# Patient Record
Sex: Male | Born: 1985 | Race: White | Hispanic: No | Marital: Single | Smoking: Never smoker
Health system: Southern US, Community
[De-identification: ages and names within clinical notes are randomized; demographics above are authoritative.]

---

## 2006-04-27 ENCOUNTER — Inpatient Hospital Stay (HOSPITAL_COMMUNITY): Admission: AC | Admit: 2006-04-27 | Discharge: 2006-04-28 | Payer: Self-pay

## 2015-11-15 ENCOUNTER — Encounter (HOSPITAL_COMMUNITY): Payer: Self-pay | Admitting: *Deleted

## 2015-11-15 ENCOUNTER — Emergency Department (HOSPITAL_COMMUNITY)
Admission: EM | Admit: 2015-11-15 | Discharge: 2015-11-16 | Disposition: A | Discharge status: Home / Self Care | Payer: No Typology Code available for payment source | Attending: Emergency Medicine | Admitting: Emergency Medicine

## 2015-11-15 ENCOUNTER — Emergency Department (HOSPITAL_COMMUNITY): Payer: No Typology Code available for payment source

## 2015-11-15 DIAGNOSIS — S60511A Abrasion of right hand, initial encounter: Secondary | ICD-10-CM | POA: Diagnosis not present

## 2015-11-15 DIAGNOSIS — Z23 Encounter for immunization: Secondary | ICD-10-CM | POA: Diagnosis not present

## 2015-11-15 DIAGNOSIS — S5002XA Contusion of left elbow, initial encounter: Secondary | ICD-10-CM | POA: Diagnosis not present

## 2015-11-15 DIAGNOSIS — Y92410 Unspecified street and highway as the place of occurrence of the external cause: Secondary | ICD-10-CM | POA: Diagnosis not present

## 2015-11-15 DIAGNOSIS — Y998 Other external cause status: Secondary | ICD-10-CM | POA: Insufficient documentation

## 2015-11-15 DIAGNOSIS — S51032A Puncture wound without foreign body of left elbow, initial encounter: Secondary | ICD-10-CM | POA: Diagnosis not present

## 2015-11-15 DIAGNOSIS — S59901A Unspecified injury of right elbow, initial encounter: Secondary | ICD-10-CM | POA: Diagnosis present

## 2015-11-15 DIAGNOSIS — Y9389 Activity, other specified: Secondary | ICD-10-CM | POA: Diagnosis not present

## 2015-11-15 DIAGNOSIS — S50811A Abrasion of right forearm, initial encounter: Secondary | ICD-10-CM | POA: Diagnosis not present

## 2015-11-15 DIAGNOSIS — S50312A Abrasion of left elbow, initial encounter: Secondary | ICD-10-CM | POA: Diagnosis not present
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
## 2015-11-16 MED ORDER — LIDOCAINE-EPINEPHRINE 2 %-1:100000 IJ SOLN
INTRAMUSCULAR | Status: AC
Start: 2015-11-16 — End: 2015-11-16
  Administered 2015-11-16: 02:00:00
  Filled 2015-11-16: qty 1

## 2015-11-16 MED ORDER — NAPROXEN 500 MG PO TABS: ORAL_TABLET | ORAL | Status: AC

## 2015-11-16 MED ORDER — TETANUS-DIPHTH-ACELL PERTUSSIS 5-2.5-18.5 LF-MCG/0.5 IM SUSP
0.5000 mL | Freq: Once | INTRAMUSCULAR | Status: AC
  Administered 2015-11-16: 0.5 mL via INTRAMUSCULAR
  Filled 2015-11-16: qty 0.5

## 2015-11-16 MED ORDER — NAPROXEN 500 MG PO TABS: 500.0000 mg | ORAL_TABLET | Freq: Once | ORAL | Status: DC

## 2015-11-16 MED ORDER — METHOCARBAMOL 500 MG PO TABS: ORAL_TABLET | ORAL | Status: AC

## 2015-11-16 MED ORDER — LIDOCAINE-EPINEPHRINE (PF) 2 %-1:200000 IJ SOLN: 10.0000 mL | Freq: Once | INTRAMUSCULAR | Status: DC

## 2015-11-16 MED ORDER — CEPHALEXIN 500 MG PO CAPS: 500.0000 mg | ORAL_CAPSULE | Freq: Three times a day (TID) | ORAL | Status: AC
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
## 2015-11-16 NOTE — ED Notes (Signed)
PA at bedside for suturing 

## 2015-11-16 NOTE — ED Notes (Signed)
Patient was alert, oriented and stable upon discharge. RN went over AVS and patient had no further questions.  

## 2015-11-16 NOTE — ED Provider Notes (Signed)
LACERATION REPAIR Performed by: Dorthula Matas Authorized by: Dorthula Matas Consent: Verbal consent obtained. Risks and benefits: risks, benefits and alternatives were discussed Consent given by: patient Patient identity confirmed: provided demographic data Prepped and Draped in normal sterile fashion Wound explored  Laceration Location: left elbow  Laceration Length: 1 cm  No Foreign Bodies seen or palpated  Anesthesia: local infiltration  Local anesthetic: lidocaine 1 % with epinephrine  Anesthetic total: 3 ml  Irrigation method: syringe Amount of cleaning: standard  Skin closure: sutures, vicryl rapide 5'0  Number of sutures: 4  Technique: subcuticular sutures  Patient tolerance: Patient tolerated the procedure well with no immediate complications.  Patient seen by Dr. Devoria Albe, please refer to her note regarding HPI, ROS, PE, DC and PLAN   Marlon Pel, PA-C 11/16/15 0217  Devoria Albe, MD 11/16/15 [phone]

## 2015-11-16 NOTE — Discharge Instructions (Signed)
Ice packs to the injured or sore muscles for the next several days then start using heat. Take the medications for pain and muscle spasms.  Recheck if you aren't improving in the next week by the orthopedist on call, Dr Dion Saucier, call his office for an appointment. Recheck sooner if the wound gets infected.    Cryotherapy Cryotherapy is when you put ice on your injury. Ice helps lessen pain and puffiness (swelling) after an injury. Ice works the best when you start using it in the first 24 to 48 hours after an injury. HOME CARE  Put a dry or damp towel between the ice pack and your skin.  You may press gently on the ice pack.  Leave the ice on for no more than 10 to 20 minutes at a time.  Check your skin after 5 minutes to make sure your skin is okay.  Rest at least 20 minutes between ice pack uses.  Stop using ice when your skin loses feeling (numbness).  Do not use ice on someone who cannot tell you when it hurts. This includes small children and people with memory problems (dementia). GET HELP RIGHT AWAY IF:  You have white spots on your skin.  Your skin turns blue or pale.  Your skin feels waxy or hard.  Your puffiness gets worse. MAKE SURE YOU:   Understand these instructions.  Will watch your condition.  Will get help right away if you are not doing well or get worse.   This information is not intended to replace advice given to you by your health care provider. Make sure you discuss any questions you have with your health care provider.   Document Released: 03/07/2008 Document Revised: 12/12/2011 Document Reviewed: 05/12/2011 Elsevier Interactive Patient Education 2016 [employer]   Elbow Contusion  An elbow contusion is a deep bruise of the elbow. Contusions happen when an injury causes bleeding under the skin. Signs of bruising include pain, puffiness (swelling), and discolored skin. The contusion may turn blue, purple, or yellow. HOME CARE  Put ice on the  injured area.  Put ice in a plastic bag.  Place a towel between your skin and the bag.  Leave the ice on for 15-20 minutes, 03-04 times a day.  Only take medicines as told by your doctor.  Rest your elbow until the pain and puffiness are better.  Raise (elevate) your elbow to lessen puffiness.  Put on an elastic wrap as told by your doctor. You can take it off for sleeping, showers, and baths. If your fingers get cold, blue, or lose feeling (numb), take the wrap off. Put the wrap back on more loosely.  Use your elbow only as told by your doctor. If you are asked to do elbow exercises, do them as told.  Keep all doctor visits as told. GET HELP RIGHT AWAY IF:  You have more redness, puffiness, or pain in your elbow.  Your puffiness or pain is not helped by medicines.  You have puffiness of the hand and fingers.  You are not able to move your fingers or wrist.  You start to lose feeling in your hand or fingers.  Your fingers or hand become cold or blue. MAKE SURE YOU:   Understand these instructions.  Will watch your condition.  Will get help right away if you are not doing well or get worse.   This information is not intended to replace advice given to you by your health care provider. Make sure  you discuss any questions you have with your health care provider.   Document Released: 09/08/2011 Document Revised: 03/20/2012 Document Reviewed: 05/04/2015 Elsevier Interactive Patient Education 2016 [employer]  Puncture Wound A puncture wound is an injury that is caused by a sharp, thin object that goes through your skin, such as a nail. A puncture wound usually does not leave a large opening in your skin, so it may not bleed a lot. However, when you get a puncture wound, dirt or other materials (foreign bodies) can be forced into your wound and break off inside. This makes it more likely that an infection will happen, such as tetanus. HOME CARE Medicines  Take or apply  over-the-counter and prescription medicines only as told by your doctor.  If you were prescribed an antibiotic medicine, take or apply it as told by your doctor. Do not stop using the antibiotic even if your condition starts to get better. Wound Care  There are many ways to close and cover a wound. For example, a wound can be covered with stitches (sutures), skin glue, or adhesive strips. Follow instructions from your doctor about:  How to take care of your wound.  When and how you should change your bandage (dressing).  When you should remove your bandage.  Removing whatever was used to close your wound.  Keep the bandage dry as told by your doctor. Do not take baths, swim, use a hot tub, or do anything that would put your wound underwater until your doctor says it is okay.  Clean the wound as told by your doctor.  Do not scratch or pick at the wound.  Check your wound every day for signs of infection. Watch for:  Redness, swelling, or pain.  Fluid, blood, or pus. General Instructions  Raise (elevate) the injured area above the level of your heart while you are sitting or lying down.  If your puncture wound is in your foot, ask your doctor if you need to avoid putting weight on your foot and for how long.  Keep all follow-up visits as told by your doctor. This is important. GET HELP IF:  You got a tetanus shot and you have any of these problems at the injection site:  Swelling.  Very bad pain.  Redness.  Bleeding.  You have a fever.  Your stitches come out.  You notice a bad smell coming from your wound or your bandage.  You notice something coming out of the wound, such as wood or glass.  Medicine does not help your pain.  You have more redness, swelling, or pain at the site of your wound.  You have fluid, blood, or pus coming from your wound.  You notice a change in the color of your skin near your wound.  You need to change the bandage often because  fluid, blood, or pus is coming from the wound.  You start to have a new rash.  You start to have numbness around the wound. GET HELP RIGHT AWAY IF:  You have very bad swelling around the wound.  Your pain suddenly gets worse and is very bad.  You start to get painful skin lumps.  You have a red streak going away from your wound.  The wound is on your hand or foot and you cannot move a finger or toe like you usually can.  The wound is on your hand or foot and you notice that your fingers or toes look pale or bluish.   This  information is not intended to replace advice given to you by your health care provider. Make sure you discuss any questions you have with your health care provider.   Document Released: 06/28/2008 Document Revised: 06/10/2015 Document Reviewed: 11/12/2014 Elsevier Interactive Patient Education [employer]

## 2017-08-08 IMAGING — CR DG ELBOW COMPLETE 3+V*L*
4 series · 4 of 4 positions shown · non-contrast
Comparison: None.

CLINICAL DATA: 29-year-old male with left elbow injury.

EXAM:
LEFT ELBOW - COMPLETE 3+ VIEW

[x elbow ap left]
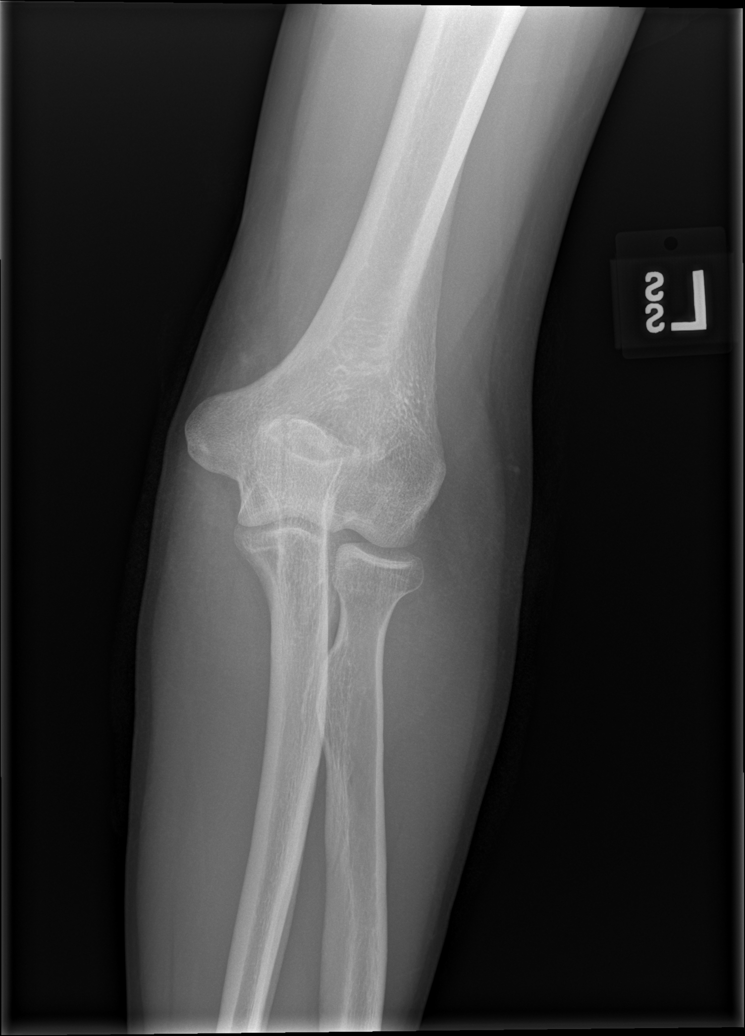

[x elbow obl left (1 of 2)]
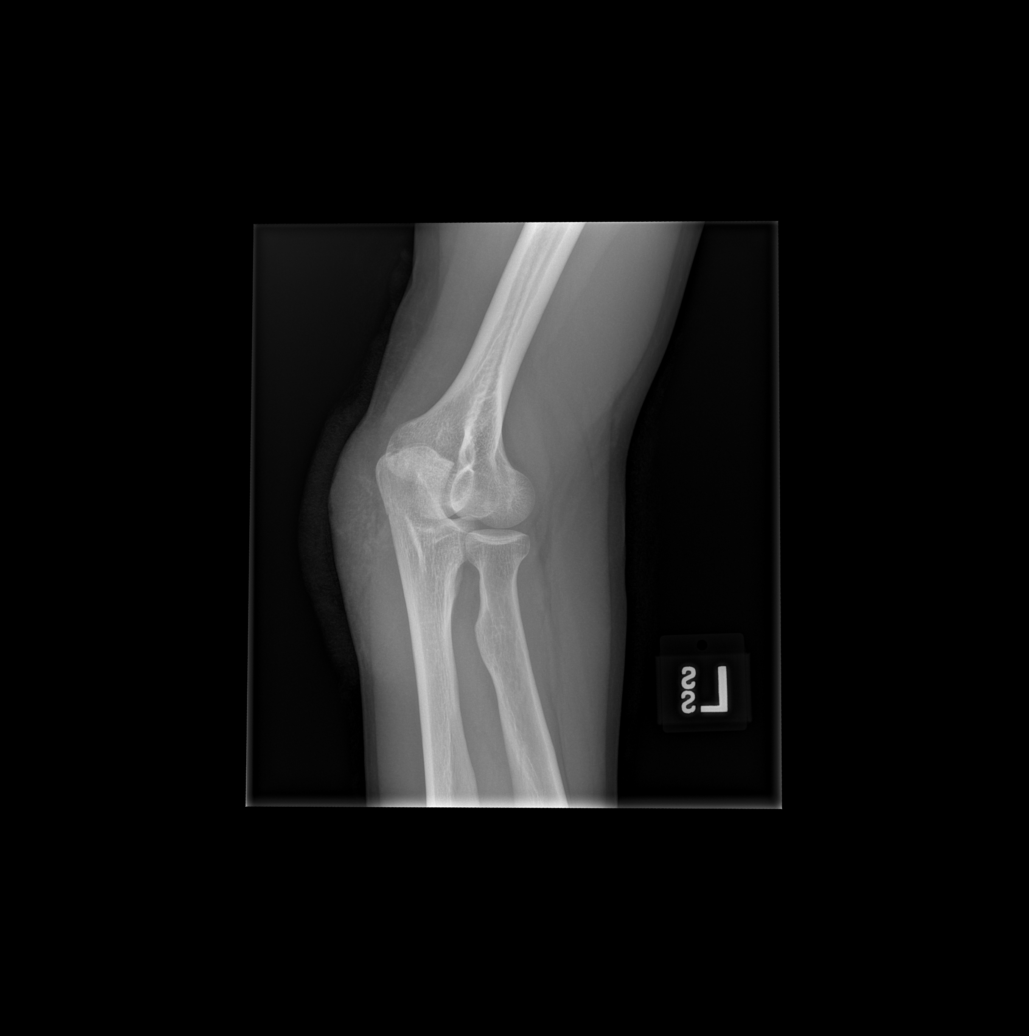

[x elbow obl left (2 of 2)]
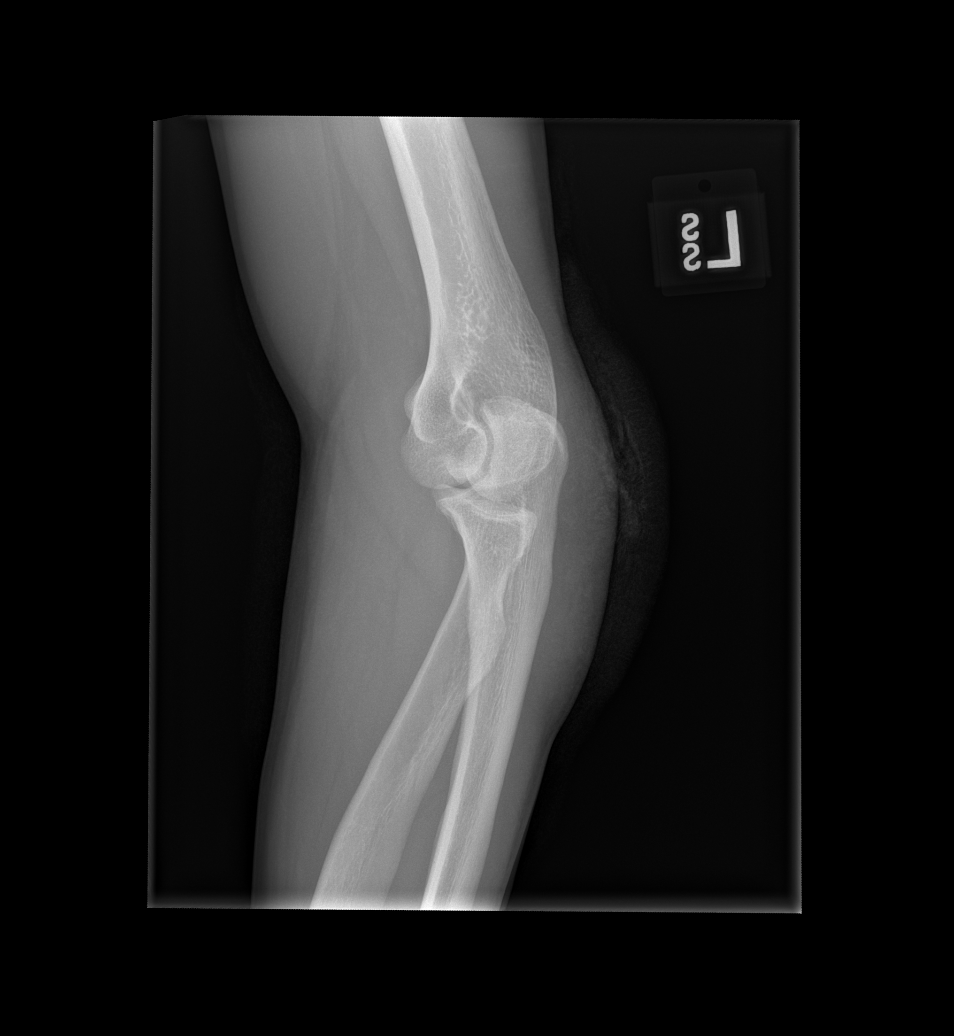

[x elbow lat left]
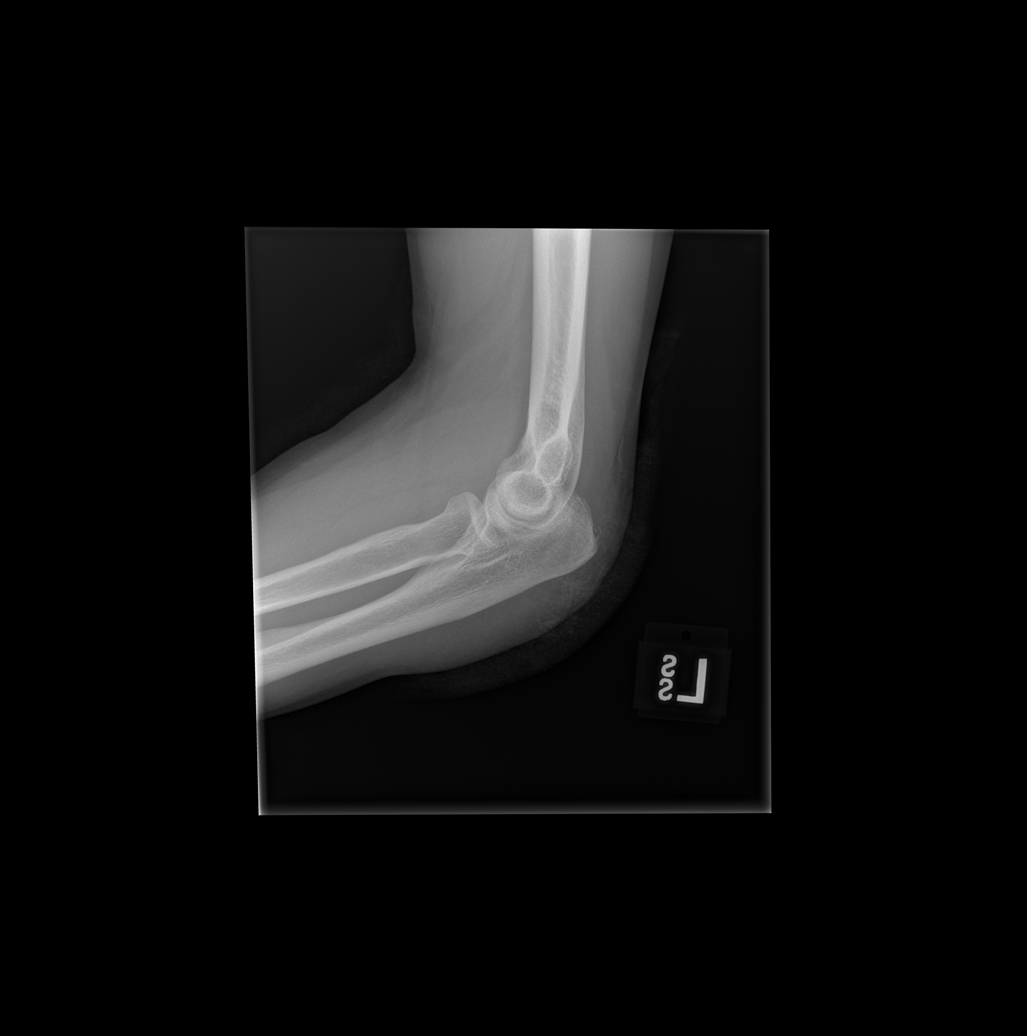

[4 of 4 positions shown; findings below may reference images not displayed]

FINDINGS: Evaluation of the elbow is limited as a true 90 degree lateral
projection is not provided. There is no acute fracture or
dislocation. The bones are well mineralized. No joint effusion
identified. There is soft tissue swelling over the dorsal aspect of
the elbow. A dressing is noted over the elbow.
IMPRESSION: No acute fracture or dislocation.
